# Patient Record
Sex: Male | Born: 1990 | Race: Black or African American | Hispanic: No | Marital: Single | Smoking: Current every day smoker
Health system: Southern US, Community
[De-identification: ages and names within clinical notes are randomized; demographics above are authoritative.]

## PROBLEM LIST (undated history)

## (undated) HISTORY — PX: APPENDECTOMY: SHX54

---

## 2014-12-26 ENCOUNTER — Emergency Department (HOSPITAL_BASED_OUTPATIENT_CLINIC_OR_DEPARTMENT_OTHER)
Admission: EM | Admit: 2014-12-26 | Discharge: 2014-12-26 | Disposition: A | Discharge status: Home / Self Care | Payer: No Typology Code available for payment source | Attending: Emergency Medicine | Admitting: Emergency Medicine

## 2014-12-26 ENCOUNTER — Encounter (HOSPITAL_BASED_OUTPATIENT_CLINIC_OR_DEPARTMENT_OTHER): Payer: Self-pay | Admitting: *Deleted

## 2014-12-26 ENCOUNTER — Emergency Department (HOSPITAL_BASED_OUTPATIENT_CLINIC_OR_DEPARTMENT_OTHER): Payer: No Typology Code available for payment source

## 2014-12-26 DIAGNOSIS — Y92410 Unspecified street and highway as the place of occurrence of the external cause: Secondary | ICD-10-CM | POA: Diagnosis not present

## 2014-12-26 DIAGNOSIS — S0121XA Laceration without foreign body of nose, initial encounter: Secondary | ICD-10-CM | POA: Insufficient documentation

## 2014-12-26 DIAGNOSIS — Y998 Other external cause status: Secondary | ICD-10-CM | POA: Diagnosis not present

## 2014-12-26 DIAGNOSIS — Y9389 Activity, other specified: Secondary | ICD-10-CM | POA: Insufficient documentation

## 2014-12-26 DIAGNOSIS — S299XXA Unspecified injury of thorax, initial encounter: Secondary | ICD-10-CM | POA: Diagnosis not present

## 2014-12-26 DIAGNOSIS — Z23 Encounter for immunization: Secondary | ICD-10-CM | POA: Insufficient documentation

## 2014-12-26 DIAGNOSIS — R51 Headache: Secondary | ICD-10-CM

## 2014-12-26 DIAGNOSIS — S0990XA Unspecified injury of head, initial encounter: Secondary | ICD-10-CM | POA: Diagnosis not present

## 2014-12-26 DIAGNOSIS — R519 Headache, unspecified: Secondary | ICD-10-CM

## 2014-12-26 DIAGNOSIS — R0781 Pleurodynia: Secondary | ICD-10-CM

## 2014-12-26 MED ORDER — KETOROLAC TROMETHAMINE 60 MG/2ML IM SOLN
60.0000 mg | Freq: Once | INTRAMUSCULAR | Status: AC
  Administered 2014-12-26: 60 mg via INTRAMUSCULAR
  Filled 2014-12-26: qty 2

## 2014-12-26 MED ORDER — TETANUS-DIPHTH-ACELL PERTUSSIS 5-2.5-18.5 LF-MCG/0.5 IM SUSP
0.5000 mL | Freq: Once | INTRAMUSCULAR | Status: AC
  Administered 2014-12-26: 0.5 mL via INTRAMUSCULAR
  Filled 2014-12-26: qty 0.5
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
## 2016-01-09 ENCOUNTER — Emergency Department (HOSPITAL_BASED_OUTPATIENT_CLINIC_OR_DEPARTMENT_OTHER)
Admission: EM | Admit: 2016-01-09 | Discharge: 2016-01-09 | Disposition: A | Discharge status: Home / Self Care | Payer: Medicaid Other | Attending: Emergency Medicine | Admitting: Emergency Medicine

## 2016-01-09 ENCOUNTER — Encounter (HOSPITAL_BASED_OUTPATIENT_CLINIC_OR_DEPARTMENT_OTHER): Payer: Self-pay | Admitting: *Deleted

## 2016-01-09 DIAGNOSIS — K0889 Other specified disorders of teeth and supporting structures: Secondary | ICD-10-CM | POA: Insufficient documentation

## 2016-01-09 MED ORDER — OXYCODONE-ACETAMINOPHEN 5-325 MG PO TABS
1.0000 | ORAL_TABLET | Freq: Once | ORAL | Status: AC
  Administered 2016-01-09: 1 via ORAL
  Filled 2016-01-09: qty 1

## 2016-01-09 MED ORDER — IBUPROFEN 800 MG PO TABS: 800.0000 mg | ORAL_TABLET | Freq: Three times a day (TID) | ORAL | Status: DC

## 2016-01-09 MED ORDER — PENICILLIN V POTASSIUM 250 MG PO TABS
500.0000 mg | ORAL_TABLET | Freq: Once | ORAL | Status: AC
  Administered 2016-01-09: 500 mg via ORAL
  Filled 2016-01-09: qty 2

## 2016-01-09 MED ORDER — PENICILLIN V POTASSIUM 500 MG PO TABS
500.0000 mg | ORAL_TABLET | Freq: Four times a day (QID) | ORAL | Status: AC
Start: 2016-01-09 — End: 2016-01-16

## 2016-01-09 NOTE — Discharge Instructions (Signed)
You have a dental injury. It is very important that you get evaluated by a dentist as soon as possible. Call tomorrow to schedule an appointment. Take your full course of antibiotics. Read the instructions below. ° °Eat a soft or liquid diet and rinse your mouth out after meals with warm water. You should see a dentist or return here at once if you have increased swelling, increased pain or uncontrolled bleeding from the site of your injury. ° °SEEK MEDICAL CARE IF:  °· You have increased pain not controlled with medicines.  °· You have swelling around your tooth, in your face or neck.  °· You have bleeding which starts, continues, or gets worse.  °· You have a fever >101 °· If you are unable to open your mouth ° °Community Resource Guide Dental °The United Way’s “211” is a great source of information about community services available.  Access by dialing 2-1-1 from anywhere in [place], or by website -  www.nc211.org.  ° °Other Local Resources (Updated 08/2015) ° °Dental  Care °  °Services ° °  °Phone Number and Address  °Cost  °[facility] For children 0 - 21 years of age:  °• Cleaning °• Tooth brushing/flossing instruction °• Sealants, fillings, crowns °• Extractions °• Emergency treatment  [phone] °[address] °[place], [place] [address] Charges based on family income.  Medicaid and some insurance plans accepted.   °  °[facility] [place] • Cleaning °• Sealants, fillings, crowns °• Extractions °• Emergency treatment [phone] °[address] °[place], [place] ° Pregnant women 18 years of age or older with a Medicaid card  °[facility] [place] • Cleaning °• Sealants, fillings, crowns °• Extractions °• Emergency treatment [phone] °[address] °[place], [place] Pregnant women 18 years of age or older with a Medicaid card  °[facility] For children 0 - 21 years of age:  °• Cleaning °• Tooth brushing/flossing instruction °• Sealants, fillings, crowns °• Extractions °• Emergency treatment °Limited orthodontic services for patients with Medicaid [phone] °[address] °, [place] [address] Medicaid and [place] Health Choice cover for children up to age 21 and pregnant women.  Parents of children up to age 21 without Medicaid pay a reduced fee at time of service.  °[facility] [place] For children 0 - 21 years of age:  °• Cleaning °• Tooth brushing/flossing instruction °• Sealants, fillings, crowns °• Extractions °• Emergency treatment °Limited orthodontic services for patients with Medicaid [phone] °[address] °[place], [place].  Medicaid and [place] Health Choice cover for children up to age 21 and pregnant women.  Parents of children up to age 21 without Medicaid pay a reduced fee.  °[facility] • Cleaning °• Sealants, fillings, crowns °• Extractions ° °Hours: Tuesdays and Thursdays, 4:15 - 8 pm [phone] °[address] °[place],  [address] Services free of charge to [place] residents ages 18-64 who do not have health insurance, Medicare, Medicaid, or VA benefits and fall within federal poverty guidelines  °[facility] ° ° ° Provides dental care in addition to primary medical care, nutritional counseling, and pharmacy: °• Cleaning °• Sealants, fillings, crowns °• Extractions ° ° ° ° ° ° ° ° ° ° ° ° ° ° ° ° ° [phone] °[facility],   [address] °[place], [place] ° °[phone] °[facility], [address] [place], [place] ° °[phone] °[facility] °[place], [place] ° °[phone] °[facility], [address] °, [place] ° °[phone] °[facility] °[address] °[place], [place]  Accepts Medicaid, Medicare, most insurance.  Also provides services available to all with fees adjusted based on ability to pay.    °[facility] • Cleaning °• Tooth brushing/flossing instruction °• Sealants, fillings, crowns °• Extractions °• Emergency treatment °Hours: Tuesdays, Thursdays, and Fridays from 8 am to 5 pm by appointment only. [phone] °[address] °[place], [place] [address] [place] residents with Medicaid (depending on eligibility) and children with [facility] Health Choice - call for more information.  °[facility] • Extractions only ° °Hours: 2nd and 4th Thursday of each month from 6:30 am - 9 am.   [phone] ext. [phone] °[address] °[place], [place] [address] Ages 18 and older only.  Patients are seen on a first come, first served basis.  °[facility] • Cleanings °• Fillings °• Extractions °• Orthodontics °• Endodontics °• Implants/Crowns/Bridges °• Complete and partial dentures [phone] °[place], [place] Patients must complete an application for services.  There is often a waiting list.   ° °

## 2016-01-09 NOTE — ED Provider Notes (Signed)
CSN: [id number]     Arrival date & time 01/09/16  1057 History   First MD Initiated Contact with Patient 01/09/16 1216     Chief Complaint  Patient presents with  . Dental Pain    (Consider location/radiation/quality/duration/timing/severity/associated sxs/prior Treatment) Patient is a 25 y.o. male presenting with tooth pain. The history is provided by the patient and medical records. No language interpreter was used.  Dental Pain Associated symptoms: no fever and no neck pain    Mitchell Hicks is a 25 y.o. male  with no pertinent PMH who presents to the Emergency Department complaining of right lower dental pain which has been progressively worsening over the last week. Ibuprofen taken with little relief. No difficulty swallowing, trouble breathing, sob, neck pain, fever. Has not seen dentist about issue.   History reviewed. No pertinent past medical history. Past Surgical History  Procedure Laterality Date  . Appendectomy     No family history on file. Social History  Substance Use Topics  . Smoking status: Never Smoker   . Smokeless tobacco: Never Used  . Alcohol Use: No    Review of Systems  Constitutional: Negative for fever.  HENT: Positive for dental problem.   Musculoskeletal: Negative for neck pain.      Allergies  Review of patient's allergies indicates no known allergies.  Home Medications   Prior to Admission medications   Medication Sig Start Date End Date Taking? Authorizing Provider  ibuprofen (ADVIL,MOTRIN) 800 MG tablet Take 1 tablet (800 mg total) by mouth 3 (three) times daily. 01/09/16   Chase PicketJaime Pilcher Jaxn Chiquito, PA-C  penicillin v potassium (VEETID) 500 MG tablet Take 1 tablet (500 mg total) by mouth 4 (four) times daily. 01/09/16 01/16/16  Liliana Dang Pilcher Calandria Mullings, PA-C   BP 131/83 mmHg  Pulse 62  Temp(Src) 99.4 F (37.4 C) (Oral)  Resp 18  Ht 5\' 10"  (1.778 m)  Wt 72.576 kg  BMI 22.96 kg/m2  SpO2 100% Physical Exam  Constitutional: He is oriented to  person, place, and time. He appears well-developed and well-nourished.  Alert and in no acute distress  HENT:  Head: Normocephalic and atraumatic.  Mouth/Throat:    Dental cavities and poor oral dentition noted, pain along tooth as depicted in image, midline uvula, no trismus, oropharynx moist and clear, no abscess noted, no oropharyngeal erythema or edema, neck supple and no tenderness. No facial edema  Cardiovascular: Normal rate, regular rhythm and normal heart sounds.  Exam reveals no gallop and no friction rub.   No murmur heard. Pulmonary/Chest: Effort normal and breath sounds normal. No respiratory distress.  Musculoskeletal: Normal range of motion.  Neurological: He is alert and oriented to person, place, and time.  Skin: Skin is warm and dry.  Nursing note and vitals reviewed.   ED Course  Procedures (including critical care time) Labs Review Labs Reviewed - No data to display  Imaging Review No results found. I have personally reviewed and evaluated these images and lab results as part of my medical decision-making.   EKG Interpretation None      MDM   Final diagnoses:  Dentalgia   Patient with dentalgia. No abscess requiring immediate incision and drainage. Patient is afebrile, non toxic appearing, and swallowing secretions well. Exam not concerning for Ludwig's angina or pharyngeal abscess. Will treat with PenVK, ibuprofen given for pain. I gave patient dental resource guide and stressed the importance of dental follow up for ultimate management of dental pain. Patient voices understanding and is agreeable to  plan.   Chase Picket Teva Bronkema, PA-C 01/09/16 1259  Rolland Porter, MD 01/13/16 [phone]

## 2016-01-09 NOTE — ED Notes (Signed)
Patient states he has pain from a right lower wisdom tooth for one week.

## 2016-01-23 ENCOUNTER — Emergency Department (HOSPITAL_BASED_OUTPATIENT_CLINIC_OR_DEPARTMENT_OTHER)
Admission: EM | Admit: 2016-01-23 | Discharge: 2016-01-23 | Disposition: A | Discharge status: Home / Self Care | Payer: Medicare Other | Attending: Emergency Medicine | Admitting: Emergency Medicine

## 2016-01-23 ENCOUNTER — Encounter (HOSPITAL_BASED_OUTPATIENT_CLINIC_OR_DEPARTMENT_OTHER): Payer: Self-pay | Admitting: *Deleted

## 2016-01-23 DIAGNOSIS — Z202 Contact with and (suspected) exposure to infections with a predominantly sexual mode of transmission: Secondary | ICD-10-CM | POA: Diagnosis not present

## 2016-01-23 DIAGNOSIS — Z711 Person with feared health complaint in whom no diagnosis is made: Secondary | ICD-10-CM

## 2016-01-23 LAB — URINALYSIS, ROUTINE W REFLEX MICROSCOPIC
Bilirubin Urine: NEGATIVE
GLUCOSE, UA: NEGATIVE mg/dL
Hgb urine dipstick: NEGATIVE
Ketones, ur: NEGATIVE mg/dL
Leukocytes, UA: NEGATIVE
Nitrite: NEGATIVE
Protein, ur: NEGATIVE mg/dL
Specific Gravity, Urine: 1.017 (ref 1.005–1.030)
pH: 7 (ref 5.0–8.0)

## 2016-01-23 NOTE — ED Provider Notes (Signed)
CSN: [id number]     Arrival date & time 01/23/16  1422 History   First MD Initiated Contact with Patient 01/23/16 1529     Chief Complaint  Patient presents with  . Exposure to STD    Mitchell Hicks is a 25 y.o. male who presents to the ED Requesting check for STDs. He presents here with his girlfriend who has been diagnosed with bacterial vaginosis. He reports he has had some intermittent pain to his abdomen that can sometimes radiate to his testicles. He last had pain yesterday. He denies current abdominal or testicle pain. He denies any symptoms of STDs. He denies any penile discharge or penile pain. He denies any scrotal swelling. He reports he is sexually active with one partner and does not use protection. No history of STDs. Patient denies fevers, vomiting, diarrhea, rashes, scrotal swelling, penile discharge, GU rashes, urinary symptoms, hematuria.    Patient is a 25 y.o. male presenting with STD exposure. The history is provided by the patient. No language interpreter was used.  Exposure to STD Associated symptoms include abdominal pain (resolved. ). Pertinent negatives include no chest pain, chills, coughing, fever, headaches, nausea, neck pain, rash, sore throat or vomiting.    History reviewed. No pertinent past medical history. Past Surgical History  Procedure Laterality Date  . Appendectomy     History reviewed. No pertinent family history. Social History  Substance Use Topics  . Smoking status: Never Smoker   . Smokeless tobacco: Never Used  . Alcohol Use: No    Review of Systems  Constitutional: Negative for fever and chills.  HENT: Negative for mouth sores and sore throat.   Eyes: Negative for visual disturbance.  Respiratory: Negative for cough and shortness of breath.   Cardiovascular: Negative for chest pain.  Gastrointestinal: Positive for abdominal pain (resolved. ). Negative for nausea, vomiting and diarrhea.  Genitourinary: Negative for dysuria, urgency,  frequency, hematuria, discharge, penile swelling, scrotal swelling, difficulty urinating, genital sores, penile pain and testicular pain.  Musculoskeletal: Negative for back pain and neck pain.  Skin: Negative for rash.  Neurological: Negative for headaches.      Allergies  Review of patient's allergies indicates no known allergies.  Home Medications   Prior to Admission medications   Not on File   BP 132/82 mmHg  Pulse 88  Temp(Src) 98.2 F (36.8 C) (Oral)  Resp 18  Ht  (1.778 m)  Wt 68.04 kg  BMI 21.52 kg/m2  SpO2 100% Physical Exam  Constitutional: He appears well-developed and well-nourished. No distress.  Nontoxic appearing.  HENT:  Head: Normocephalic and atraumatic.  Mouth/Throat: Oropharynx is clear and moist. No oropharyngeal exudate.  Eyes: Conjunctivae are normal. Pupils are equal, round, and reactive to light. Right eye exhibits no discharge. Left eye exhibits no discharge.  Neck: Neck supple.  Cardiovascular: Normal rate, regular rhythm, normal heart sounds and intact distal pulses.   Pulmonary/Chest: Effort normal and breath sounds normal. No respiratory distress. He has no wheezes. He has no rales.  Abdominal: Soft. Bowel sounds are normal. He exhibits no distension and no mass. There is no tenderness. There is no rebound and no guarding.  Abdomen soft and nontender to palpation. Bowel sounds are present.  Genitourinary: Penis normal. No penile tenderness.  GU exam performed by me with male nurse tech chaperone. No GU rashes noted. No penile tenderness or discharge. Scrotal contents is normal. No scrotal swelling or tenderness. No inguinal hernias appreciated.  Musculoskeletal: He exhibits no edema.  Lymphadenopathy:    He has no cervical adenopathy.  Neurological: He is alert. Coordination normal.  Skin: Skin is warm and dry. No rash noted. He is not diaphoretic. No erythema. No pallor.  Psychiatric: He has a normal mood and affect. His behavior is  normal.  Nursing note and vitals reviewed.   ED Course  Procedures (including critical care time) Labs Review Labs Reviewed  URINALYSIS, ROUTINE W REFLEX MICROSCOPIC (NOT [facility])  RPR  HIV ANTIBODY (ROUTINE TESTING)  GC/CHLAMYDIA PROBE AMP ([place]) NOT [facility]    Imaging Review No results found. I have personally reviewed and evaluated these lab results as part of my medical decision-making.   EKG Interpretation None      Filed Vitals:   01/23/16 1425  BP: 132/82  Pulse: 88  Temp: 98.2 F (36.8 C)  TempSrc: Oral  Resp: 18  Height: 5\' 10"  (1.778 m)  Weight: 68.04 kg  SpO2: 100%     MDM   Meds given in ED:  Medications - No data to display  New Prescriptions   No medications on file    Final diagnoses:  Concern about STD in male without diagnosis   This is a 25 y.o. male who presents to the ED Requesting check for STDs. He presents here with his girlfriend who has been diagnosed with bacterial vaginosis. He reports he has had some intermittent pain to his abdomen that can sometimes radiate to his testicles. He last had pain yesterday. He denies current abdominal or testicle pain. He denies any symptoms of STDs. He denies any penile discharge or penile pain. He denies any scrotal swelling. He reports he is sexually active with one partner and does not use protection.  On exam the patient is afebrile and nontoxic appearing. His abdomen is soft and nontender to palpation. GU exam is unremarkable. No penile discharge. No penile or scrotal tenderness to palpation. We'll check for syphilis, HIV, gonorrhea and chlamydia. I advised that these tests are pending and he should follow-up in about 3 days for the results. Urinalysis is within normal limits. As the patient has no current symptoms of an STD, will wait for treatment until test results return. I encouraged the patient to abstain from sex until he has test results. Discussed safe sex practices. I advised the patient  to follow-up with their primary care provider this week. I advised the patient to return to the emergency department with new or worsening symptoms or new concerns. The patient verbalized understanding and agreement with plan.      Everlene FarrierWilliam Amanda Steuart, PA-C 01/23/16 1616  Laurence Spatesachel Morgan Little, MD 01/24/16 [phone]

## 2016-01-23 NOTE — ED Notes (Signed)
Pt c/o abd and scrotal pain for a couple of days. Denies d/c or urinary s/s.

## 2016-01-23 NOTE — ED Notes (Signed)
Pt requesting STD check. Denies symptoms.  

## 2016-01-23 NOTE — ED Notes (Signed)
PA in to evaluate pt.  

## 2016-01-23 NOTE — Discharge Instructions (Signed)
We have labs pending for gonorrhea, chlamydia, HIV and syphilis for you. These tests take approximately 3 days to return. Please follow-up in approximately 3 days on these test results. Please see take over [facility] health Department for routine STD checks.  Sexually Transmitted Disease A sexually transmitted disease (STD) is a disease or infection that may be passed (transmitted) from person to person, usually during sexual activity. This may happen by way of saliva, semen, blood, vaginal mucus, or urine. Common STDs include:  Gonorrhea.  Chlamydia.  Syphilis.  HIV and AIDS.  Genital herpes.  Hepatitis B and C.  Trichomonas.  Human papillomavirus (HPV).  Pubic lice.  Scabies.  Mites.  Bacterial vaginosis. WHAT ARE CAUSES OF STDs? An STD may be caused by bacteria, a virus, or parasites. STDs are often transmitted during sexual activity if one person is infected. However, they may also be transmitted through nonsexual means. STDs may be transmitted after:   Sexual intercourse with an infected person.  Sharing sex toys with an infected person.  Sharing needles with an infected person or using unclean piercing or tattoo needles.  Having intimate contact with the genitals, mouth, or rectal areas of an infected person.  Exposure to infected fluids during birth. WHAT ARE THE SIGNS AND SYMPTOMS OF STDs? Different STDs have different symptoms. Some people may not have any symptoms. If symptoms are present, they may include:  Painful or bloody urination.  Pain in the pelvis, abdomen, vagina, anus, throat, or eyes.  A skin rash, itching, or irritation.  Growths, ulcerations, blisters, or sores in the genital and anal areas.  Abnormal vaginal discharge with or without bad odor.  Penile discharge in men.  Fever.  Pain or bleeding during sexual intercourse.  Swollen glands in the groin area.  Yellow skin and eyes (jaundice). This is seen with hepatitis.  Swollen  testicles.  Infertility.  Sores and blisters in the mouth. HOW ARE STDs DIAGNOSED? To make a diagnosis, your health care provider may:  Take a medical history.  Perform a physical exam.  Take a sample of any discharge to examine.  Swab the throat, cervix, opening to the penis, rectum, or vagina for testing.  Test a sample of your first morning urine.  Perform blood tests.  Perform a Pap test, if this applies.  Perform a colposcopy.  Perform a laparoscopy. HOW ARE STDs TREATED? Treatment depends on the STD. Some STDs may be treated but not cured.  Chlamydia, gonorrhea, trichomonas, and syphilis can be cured with antibiotic medicine.  Genital herpes, hepatitis, and HIV can be treated, but not cured, with prescribed medicines. The medicines lessen symptoms.  Genital warts from HPV can be treated with medicine or by freezing, burning (electrocautery), or surgery. Warts may come back.  HPV cannot be cured with medicine or surgery. However, abnormal areas may be removed from the cervix, vagina, or vulva.  If your diagnosis is confirmed, your recent sexual partners need treatment. This is true even if they are symptom-free or have a negative culture or evaluation. They should not have sex until their health care providers say it is okay.  Your health care provider may test you for infection again 3 months after treatment. HOW CAN I REDUCE MY RISK OF GETTING AN STD? Take these steps to reduce your risk of getting an STD:  Use latex condoms, dental dams, and water-soluble lubricants during sexual activity. Do not use petroleum jelly or oils.  Avoid having multiple sex partners.  Do not have sex  with someone who has other sex partners  Do not have sex with anyone you do not know or who is at high risk for an STD.  Avoid risky sex practices that can break your skin.  Do not have sex if you have open sores on your mouth or skin.  Avoid drinking too much alcohol or taking  illegal drugs. Alcohol and drugs can affect your judgment and put you in a vulnerable position.  Avoid engaging in oral and anal sex acts.  Get vaccinated for HPV and hepatitis. If you have not received these vaccines in the past, talk to your health care provider about whether one or both might be right for you.  If you are at risk of being infected with HIV, it is recommended that you take a prescription medicine daily to prevent HIV infection. This is called pre-exposure prophylaxis (PrEP). You are considered at risk if:  You are a man who has sex with other men (MSM).  You are a heterosexual man or woman and are sexually active with more than one partner.  You take drugs by injection.  You are sexually active with a partner who has HIV.  Talk with your health care provider about whether you are at high risk of being infected with HIV. If you choose to begin PrEP, you should first be tested for HIV. You should then be tested every 3 months for as long as you are taking PrEP. WHAT SHOULD I DO IF I THINK I HAVE AN STD?  See your health care provider.  Tell your sexual partner(s). They should be tested and treated for any STDs.  Do not have sex until your health care provider says it is okay. WHEN SHOULD I GET IMMEDIATE MEDICAL CARE? Contact your health care provider right away if:   You have severe abdominal pain.  You are a man and notice swelling or pain in your testicles.  You are a woman and notice swelling or pain in your vagina.   This information is not intended to replace advice given to you by your health care provider. Make sure you discuss any questions you have with your health care provider.   Document Released: 10/28/2002 Document Revised: 08/28/2014 Document Reviewed: 02/25/2013 Elsevier Interactive Patient Education [employer]2016 Elsevier Inc.

## 2016-01-23 NOTE — ED Notes (Signed)
Pt given d/c instructions as per chart. Verbalizes understanding. No questions. 

## 2016-01-23 NOTE — ED Notes (Signed)
Patient eating chips in the waiting room prior to triage, Patient obtaining urine sample at this time.

## 2016-01-24 LAB — GC/CHLAMYDIA PROBE AMP (~~LOC~~) NOT AT ARMC
CHLAMYDIA, DNA PROBE: NEGATIVE
Neisseria Gonorrhea: NEGATIVE

## 2016-01-25 LAB — RPR: RPR Ser Ql: NONREACTIVE

## 2016-01-25 LAB — HIV ANTIBODY (ROUTINE TESTING W REFLEX): HIV Screen 4th Generation wRfx: NONREACTIVE

## 2017-09-12 ENCOUNTER — Other Ambulatory Visit: Payer: Self-pay

## 2017-09-12 ENCOUNTER — Encounter (HOSPITAL_BASED_OUTPATIENT_CLINIC_OR_DEPARTMENT_OTHER): Payer: Self-pay

## 2017-09-12 ENCOUNTER — Emergency Department (HOSPITAL_BASED_OUTPATIENT_CLINIC_OR_DEPARTMENT_OTHER)
Admission: EM | Admit: 2017-09-12 | Discharge: 2017-09-12 | Disposition: A | Discharge status: Home / Self Care | Payer: Medicare Other | Attending: Physician Assistant | Admitting: Physician Assistant

## 2017-09-12 DIAGNOSIS — F17200 Nicotine dependence, unspecified, uncomplicated: Secondary | ICD-10-CM | POA: Insufficient documentation

## 2017-09-12 DIAGNOSIS — R05 Cough: Secondary | ICD-10-CM | POA: Diagnosis present

## 2017-09-12 DIAGNOSIS — J111 Influenza due to unidentified influenza virus with other respiratory manifestations: Secondary | ICD-10-CM | POA: Diagnosis not present

## 2017-09-12 DIAGNOSIS — R69 Illness, unspecified: Secondary | ICD-10-CM

## 2017-09-12 MED ORDER — OSELTAMIVIR PHOSPHATE 75 MG PO CAPS: 75.0000 mg | ORAL_CAPSULE | Freq: Two times a day (BID) | ORAL | 0 refills | Status: AC

## 2017-09-12 MED ORDER — BENZONATATE 100 MG PO CAPS: 100.0000 mg | ORAL_CAPSULE | Freq: Three times a day (TID) | ORAL | 0 refills | Status: AC | PRN

## 2017-09-12 MED ORDER — ONDANSETRON 4 MG PO TBDP: 4.0000 mg | ORAL_TABLET | Freq: Three times a day (TID) | ORAL | 0 refills | Status: AC | PRN

## 2017-09-12 MED ORDER — IBUPROFEN 800 MG PO TABS
800.0000 mg | ORAL_TABLET | Freq: Once | ORAL | Status: AC
  Administered 2017-09-12: 800 mg via ORAL
  Filled 2017-09-12: qty 1

## 2017-09-12 NOTE — ED Triage Notes (Signed)
C/o flu like sx x 3 days-NAD-steady gait 

## 2017-09-12 NOTE — ED Provider Notes (Signed)
[facility] EMERGENCY DEPARTMENT Provider Note   CSN: [id number] Arrival date & time: 09/12/17  2004     History   Chief Complaint Chief Complaint  Patient presents with  . Cough    HPI Mitchell Hicks is a 27 y.o. male.  HPI   Patient here with flulike symptoms.  People at patient has mild nausea, decreased appetite, high fevers, body aches.  Patient's significant other has been treating with ibuprofen Tylenol.  Patient has children at home, all above of the age of 6 months.  History reviewed. No pertinent past medical history.  There are no active problems to display for this patient.   Past Surgical History:  Procedure Laterality Date  . APPENDECTOMY         Home Medications    Prior to Admission medications   Medication Sig Start Date End Date Taking? Authorizing Provider  benzonatate (TESSALON PERLES) 100 MG capsule Take 1 capsule (100 mg total) by mouth 3 (three) times daily as needed for cough. 09/12/17   Morad Tal Lyn, MD  ondansetron (ZOFRAN ODT) 4 MG disintegrating tablet Take 1 tablet (4 mg total) by mouth every 8 (eight) hours as needed for nausea or vomiting. 09/12/17   Mehek Grega Lyn, MD  oseltamivir (TAMIFLU) 75 MG capsule Take 1 capsule (75 mg total) by mouth every 12 (twelve) hours. 09/12/17   Jessicah Croll, Cindee Saltourteney Lyn, MD    Family History No family history on file.  Social History Social History   Tobacco Use  . Smoking status: Current Every Day Smoker  . Smokeless tobacco: Never Used  Substance Use Topics  . Alcohol use: No  . Drug use: No     Allergies   Patient has no known allergies.   Review of Systems Review of Systems  Constitutional: Positive for fatigue and fever. Negative for activity change.  HENT: Positive for congestion and sore throat.   Respiratory: Positive for cough. Negative for shortness of breath.   Cardiovascular: Negative for chest pain.  Gastrointestinal: Negative for abdominal pain.       Physical Exam Updated Vital Signs BP 120/75 (BP Location: Left Arm)   Pulse 95   Temp 100.2 F (37.9 C) (Oral)   Resp 20   Ht 5\' 11"  (1.803 m)   Wt 65 kg (143 lb 4.8 oz)   SpO2 98%   BMI 19.99 kg/m   Physical Exam  Constitutional: He is oriented to person, place, and time. He appears well-nourished.  HENT:  Head: Normocephalic.  Mouth/Throat: Oropharynx is clear and moist.  Eyes: Conjunctivae are normal. Right eye exhibits no discharge. Left eye exhibits no discharge.  Cardiovascular: Normal rate and regular rhythm.  No murmur heard. Pulmonary/Chest: Effort normal and breath sounds normal. No respiratory distress.  Neurological: He is oriented to person, place, and time.  Skin: Skin is warm and dry. He is not diaphoretic.  Psychiatric: He has a normal mood and affect. His behavior is normal.     ED Treatments / Results  Labs (all labs ordered are listed, but only abnormal results are displayed) Labs Reviewed - No data to display  EKG  EKG Interpretation None       Radiology No results found.  Procedures Procedures (including critical care time)  Medications Ordered in ED Medications  ibuprofen (ADVIL,MOTRIN) tablet 800 mg (800 mg Oral Given 09/12/17 2021)     Initial Impression / Assessment and Plan / ED Course  I have reviewed the triage vital signs and the nursing  notes.  Pertinent labs & imaging results that were available during my care of the patient were reviewed by me and considered in my medical decision making (see chart for details).    Patient here with flulike symptoms.  People at patient has mild nausea, decreased appetite, high fevers, body aches.  Patient's significant other has been treating with ibuprofen Tylenol.  Patient has children at home, all above of the age of 6 months.  9:50 PM Sounds consistent with the flu.  Discussed Tamiflu, will give prescription.  Will give prescription for Zofran and Tessalon Perles.  Will have  follow-up with primary care physician.  Discussed ibuprofen and Tylenol to keep fever down.  Final Clinical Impressions(s) / ED Diagnoses   Final diagnoses:  Influenza-like illness    ED Discharge Orders        Ordered    oseltamivir (TAMIFLU) 75 MG capsule  Every 12 hours     09/12/17 2148    ondansetron (ZOFRAN ODT) 4 MG disintegrating tablet  Every 8 hours PRN     09/12/17 2148    benzonatate (TESSALON PERLES) 100 MG capsule  3 times daily PRN     09/12/17 2148       Abelino Derrick, MD 09/12/17 2151

## 2017-09-12 NOTE — Discharge Instructions (Signed)
You have the flu.  Please do not exposures anyone else to your germs.  Please wash hands.  Please stay inside.  Please take ibuprofen and Tylenol alternatively to help with your symptoms.  Please return with any concerns.

## 2018-03-25 ENCOUNTER — Emergency Department (HOSPITAL_BASED_OUTPATIENT_CLINIC_OR_DEPARTMENT_OTHER)
Admission: EM | Admit: 2018-03-25 | Discharge: 2018-03-25 | Disposition: A | Discharge status: Home / Self Care | Payer: Medicare Other | Attending: Emergency Medicine | Admitting: Emergency Medicine

## 2018-03-25 ENCOUNTER — Other Ambulatory Visit: Payer: Self-pay

## 2018-03-25 ENCOUNTER — Encounter (HOSPITAL_BASED_OUTPATIENT_CLINIC_OR_DEPARTMENT_OTHER): Payer: Self-pay | Admitting: Emergency Medicine

## 2018-03-25 ENCOUNTER — Emergency Department (HOSPITAL_BASED_OUTPATIENT_CLINIC_OR_DEPARTMENT_OTHER): Payer: Medicare Other

## 2018-03-25 DIAGNOSIS — R079 Chest pain, unspecified: Secondary | ICD-10-CM | POA: Diagnosis present

## 2018-03-25 DIAGNOSIS — F17210 Nicotine dependence, cigarettes, uncomplicated: Secondary | ICD-10-CM | POA: Insufficient documentation

## 2018-03-25 DIAGNOSIS — R0789 Other chest pain: Secondary | ICD-10-CM | POA: Insufficient documentation

## 2018-03-25 MED ORDER — IBUPROFEN 400 MG PO TABS
400.0000 mg | ORAL_TABLET | Freq: Once | ORAL | Status: AC
  Administered 2018-03-25: 400 mg via ORAL
  Filled 2018-03-25: qty 1

## 2018-03-25 NOTE — ED Triage Notes (Signed)
Pt ran into the basketball pole yesterday while playing basketball.  Presents for pain in the left side of his chest where he hit the pole.  Also pain in both ankles where the "curled" when he came down on them. Also pain in both wrists.  No direct injury to his wrists but pt sts his wrists have been "messed up for a while" and hitting the pole has caused them to hurt as well. Pt denies falling.

## 2018-03-25 NOTE — ED Provider Notes (Signed)
[facility] EMERGENCY DEPARTMENT Provider Note   CSN: [id number] Arrival date & time: 03/25/18  1015     History   Chief Complaint Chief Complaint  Patient presents with  . Chest Pain    HPI Mitchell Hicks is a 27 y.o. male.  Patient is a healthy 27 year old male presenting today with left-sided chest pain that started after he ran into a pole while playing basketball.  He states the pain is just persisted and is much worse if he coughs or takes a deep breath.  He denies any shortness of breath.  He has no abdominal pain.  He does state that his left wrist and both ankles are sore but it was the first time he had played basketball in quite some time.  The history is provided by the patient.  Chest Pain   This is a new problem. The current episode started yesterday. The problem occurs constantly. The problem has not changed since onset.Associated with: started after he ran into a pole while playing basketball. The pain is present in the lateral region. The pain is at a severity of 5/10. The pain is moderate. The quality of the pain is described as pleuritic. The pain does not radiate. Duration of episode(s) is 1 day. The symptoms are aggravated by deep breathing and certain positions. Pertinent negatives include no cough, no fever, no palpitations and no shortness of breath. He has tried rest for the symptoms. The treatment provided no relief. There are no known risk factors.    History reviewed. No pertinent past medical history.  There are no active problems to display for this patient.   Past Surgical History:  Procedure Laterality Date  . APPENDECTOMY          Home Medications    Prior to Admission medications   Medication Sig Start Date End Date Taking? Authorizing Provider  benzonatate (TESSALON PERLES) 100 MG capsule Take 1 capsule (100 mg total) by mouth 3 (three) times daily as needed for cough. 09/12/17   Mackuen, Courteney Lyn, MD  ondansetron (ZOFRAN  ODT) 4 MG disintegrating tablet Take 1 tablet (4 mg total) by mouth every 8 (eight) hours as needed for nausea or vomiting. 09/12/17   Mackuen, Courteney Lyn, MD  oseltamivir (TAMIFLU) 75 MG capsule Take 1 capsule (75 mg total) by mouth every 12 (twelve) hours. 09/12/17   Mackuen, Cindee Salt, MD    Family History No family history on file.  Social History Social History   Tobacco Use  . Smoking status: Current Every Day Smoker    Packs/day: 1.00    Types: Cigarettes  . Smokeless tobacco: Never Used  Substance Use Topics  . Alcohol use: No  . Drug use: No     Allergies   Patient has no known allergies.   Review of Systems Review of Systems  Constitutional: Negative for fever.  Respiratory: Negative for cough and shortness of breath.   Cardiovascular: Positive for chest pain. Negative for palpitations.  All other systems reviewed and are negative.    Physical Exam Updated Vital Signs BP 113/66 (BP Location: Right Arm)   Pulse 82   Temp 97.9 F (36.6 C) (Oral)   Resp 16   Ht 5\' 11"  (1.803 m)   Wt 65.8 kg (145 lb)   SpO2 100%   BMI 20.22 kg/m   Physical Exam  Constitutional: He is oriented to person, place, and time. He appears well-developed and well-nourished. No distress.  HENT:  Head: Normocephalic and atraumatic.  Mouth/Throat: Oropharynx is clear and moist.  Eyes: Pupils are equal, round, and reactive to light. Conjunctivae and EOM are normal.  Neck: Normal range of motion. Neck supple.  Cardiovascular: Normal rate, regular rhythm and intact distal pulses.  No murmur heard. Pulmonary/Chest: Effort normal and breath sounds normal. No respiratory distress. He has no wheezes. He has no rales. He exhibits bony tenderness. He exhibits no crepitus.    Abdominal: Soft. He exhibits no distension. There is no tenderness. There is no rebound and no guarding.  Musculoskeletal: Normal range of motion. He exhibits no edema or tenderness.  Full range of motion of  bilateral wrists and ankles.  No localized point tenderness.  No swelling identified.  Neurological: He is alert and oriented to person, place, and time.  Skin: Skin is warm and dry. No rash noted. No erythema.  Psychiatric: He has a normal mood and affect. His behavior is normal.  Nursing note and vitals reviewed.    ED Treatments / Results  Labs (all labs ordered are listed, but only abnormal results are displayed) Labs Reviewed - No data to display  EKG EKG Interpretation  Date/Time:  Monday March 25 2018 10:24:32 EDT Ventricular Rate:  78 PR Interval:    QRS Duration: 90 QT Interval:  406 QTC Calculation: 463 R Axis:   81 Text Interpretation:  Sinus rhythm Borderline short PR interval ST elev, probable normal early repol pattern No previous tracing Confirmed by Gwyneth SproutPlunkett, Aodhan Scheidt ([id number]) on 03/25/2018 10:42:20 AM   Radiology Dg Ribs Unilateral W/chest Left  Result Date: 03/25/2018 CLINICAL DATA:  Injury while playing basketball EXAM: LEFT RIBS AND CHEST - 3+ VIEW COMPARISON:  Dec 26, 2014 FINDINGS: Frontal chest as well as oblique and cone-down rib images obtained. Lungs are clear. Heart size and pulmonary vascularity are normal. No adenopathy. No pneumothorax or pleural effusion evident. No evident rib fracture. IMPRESSION: No evident rib fracture.  Lungs clear. Electronically Signed   By: Bretta BangWilliam  Woodruff III M.D.   On: 03/25/2018 11:07    Procedures Procedures (including critical care time)  Medications Ordered in ED Medications  ibuprofen (ADVIL,MOTRIN) tablet 400 mg (has no administration in time range)     Initial Impression / Assessment and Plan / ED Course  I have reviewed the triage vital signs and the nursing notes.  Pertinent labs & imaging results that were available during my care of the patient were reviewed by me and considered in my medical decision making (see chart for details).     Patient presenting today after an injury to the left chest while  playing basketball yesterday.  He does complain of mild aching of his wrists and ankles but that is most likely related to overuse yesterday since he had not played for quite some time.  There is no evidence of injury to these joints and do not feel he need imaging.  Patient is not short of breath and vital signs are reassuring.  Will get an x-ray to ensure no rib fracture.  Patient given ibuprofen.  EKG was done because he was having pain in the left side of his chest but chest is otherwise normal except for early repolarization.  11:18 AM CXR wnl.  Will treat for MSK pain.  Final Clinical Impressions(s) / ED Diagnoses   Final diagnoses:  Chest wall pain    ED Discharge Orders    None       Gwyneth SproutPlunkett, Mariellen Blaney, MD 03/25/18 1120

## 2018-08-08 ENCOUNTER — Emergency Department (HOSPITAL_BASED_OUTPATIENT_CLINIC_OR_DEPARTMENT_OTHER)
Admission: EM | Admit: 2018-08-08 | Discharge: 2018-08-08 | Disposition: A | Discharge status: Home / Self Care | Payer: Medicare Other | Attending: Emergency Medicine | Admitting: Emergency Medicine

## 2018-08-08 ENCOUNTER — Emergency Department (HOSPITAL_BASED_OUTPATIENT_CLINIC_OR_DEPARTMENT_OTHER): Payer: Medicare Other

## 2018-08-08 ENCOUNTER — Encounter (HOSPITAL_BASED_OUTPATIENT_CLINIC_OR_DEPARTMENT_OTHER): Payer: Self-pay

## 2018-08-08 ENCOUNTER — Other Ambulatory Visit: Payer: Self-pay

## 2018-08-08 DIAGNOSIS — M79641 Pain in right hand: Secondary | ICD-10-CM | POA: Diagnosis present

## 2018-08-08 DIAGNOSIS — F17210 Nicotine dependence, cigarettes, uncomplicated: Secondary | ICD-10-CM | POA: Insufficient documentation

## 2018-08-08 NOTE — Discharge Instructions (Addendum)
Your Xray today was negative, you may alternate ibuprofen or tylenol for your pain. I have provided a velcro splint, you may wear this as needed for comfort.

## 2018-08-08 NOTE — ED Provider Notes (Signed)
[facility] EMERGENCY DEPARTMENT Provider Note   CSN: [id number] Arrival date & time: 08/08/18  1747     History   Chief Complaint Chief Complaint  Patient presents with  . Hand Injury    HPI Mitchell Hicks is a 27 y.o. male.  [age] y.o male with no PMH presents to the ED with a chief complaint of right arm pain x today. Patient reports punching a wall this morning after being angry.He reports the pain is located on the ulnar region of the hand and right thumb worst with movement and flexion and no alleviating factors. He denies any therapy for symptomatic relieve. He denies any other trauma.      History reviewed. No pertinent past medical history.  There are no active problems to display for this patient.   Past Surgical History:  Procedure Laterality Date  . APPENDECTOMY          Home Medications    Prior to Admission medications   Medication Sig Start Date End Date Taking? Authorizing Provider  benzonatate (TESSALON PERLES) 100 MG capsule Take 1 capsule (100 mg total) by mouth 3 (three) times daily as needed for cough. 09/12/17   Mackuen, Courteney Lyn, MD  ondansetron (ZOFRAN ODT) 4 MG disintegrating tablet Take 1 tablet (4 mg total) by mouth every 8 (eight) hours as needed for nausea or vomiting. 09/12/17   Mackuen, Courteney Lyn, MD  oseltamivir (TAMIFLU) 75 MG capsule Take 1 capsule (75 mg total) by mouth every 12 (twelve) hours. 09/12/17   Mackuen, Cindee Saltourteney Lyn, MD    Family History No family history on file.  Social History Social History   Tobacco Use  . Smoking status: Current Every Day Smoker    Packs/day: 1.00    Types: Cigarettes  . Smokeless tobacco: Never Used  Substance Use Topics  . Alcohol use: No  . Drug use: No     Allergies   Patient has no known allergies.   Review of Systems Review of Systems  Constitutional: Negative for fever.  Musculoskeletal: Positive for arthralgias and myalgias.     Physical Exam Updated  Vital Signs BP 117/76 (BP Location: Left Arm)   Pulse 76   Temp 99.8 F (37.7 C) (Oral)   Resp 16   Ht 5\' 11"  (1.803 m)   Wt 63.5 kg   SpO2 100%   BMI 19.53 kg/m   Physical Exam Vitals signs and nursing note reviewed.  Constitutional:      Appearance: Normal appearance. He is well-developed.  HENT:     Head: Normocephalic and atraumatic.  Eyes:     General: No scleral icterus.    Pupils: Pupils are equal, round, and reactive to light.  Neck:     Musculoskeletal: Normal range of motion.  Cardiovascular:     Heart sounds: Normal heart sounds.  Pulmonary:     Effort: Pulmonary effort is normal.     Breath sounds: Normal breath sounds. No wheezing.  Chest:     Chest wall: No tenderness.  Abdominal:     General: Bowel sounds are normal. There is no distension.     Palpations: Abdomen is soft.     Tenderness: There is no abdominal tenderness.  Musculoskeletal:        General: No deformity.     Right wrist: He exhibits no tenderness and no bony tenderness.     Right hand: He exhibits tenderness and swelling. He exhibits normal capillary refill and no laceration. Normal sensation noted. Decreased  sensation is not present in the ulnar distribution, is not present in the medial distribution and is not present in the radial distribution. Normal strength noted. He exhibits no finger abduction, no thumb/finger opposition and no wrist extension trouble.  Skin:    General: Skin is warm and dry.  Neurological:     Mental Status: He is alert and oriented to person, place, and time.      ED Treatments / Results  Labs (all labs ordered are listed, but only abnormal results are displayed) Labs Reviewed - No data to display  EKG None  Radiology Dg Hand Complete Right  Result Date: 08/08/2018 CLINICAL DATA:  Pain and swelling over fifth metacarpal. EXAM: RIGHT HAND - COMPLETE 3+ VIEW COMPARISON:  None. FINDINGS: No fracture to the fifth metacarpal identified to explain the  patient's symptoms. The fingers are intact. Well cortical calcifications distal to the ulnar likely from previous trauma. No definitive acute carpal bone fracture noted. IMPRESSION: No acute fracture identified. Probable old fracture of the distal ulna. Electronically Signed   By: Gerome Samavid  Williams III M.D   On: 08/08/2018 18:47    Procedures Procedures (including critical care time)  Medications Ordered in ED Medications - No data to display   Initial Impression / Assessment and Plan / ED Course  I have reviewed the triage vital signs and the nursing notes.  Pertinent labs & imaging results that were available during my care of the patient were reviewed by me and considered in my medical decision making (see chart for details).    Patient presents with right hand pain after punching a wall this morning. He reports pain is worse with flexion and around this thumb region. He has not tried any therapy for relieve.  Valuation patient is texting on his cell phone with girlfriend at the bedside along with ice to his right hand.  DG right hand showed no acute fracture, probable old fracture of the distal ulna which is healing.  She will be provided with a Velcro splint for comfort along with return precautions.  He is well-appearing, with girlfriend at the bedside sleeping.  Return precautions provided.   Final Clinical Impressions(s) / ED Diagnoses   Final diagnoses:  Right hand pain    ED Discharge Orders    None       Claude MangesSoto, Karel Turpen, Cordelia Poche-C 08/08/18 1914    Jacalyn LefevreHaviland, Julie, MD 08/08/18 1918

## 2018-08-08 NOTE — ED Triage Notes (Signed)
Pt c/o pain to right hand/wrist after punching a wall this am-no break in skin noted-NAD-steady gait

## 2020-03-11 IMAGING — CR DG HAND COMPLETE 3+V*R*
3 series · 3 of 3 positions shown · non-contrast
Comparison: None.

CLINICAL DATA: Pain and swelling over fifth metacarpal.

EXAM:
RIGHT HAND - COMPLETE 3+ VIEW

[x hand pa right]
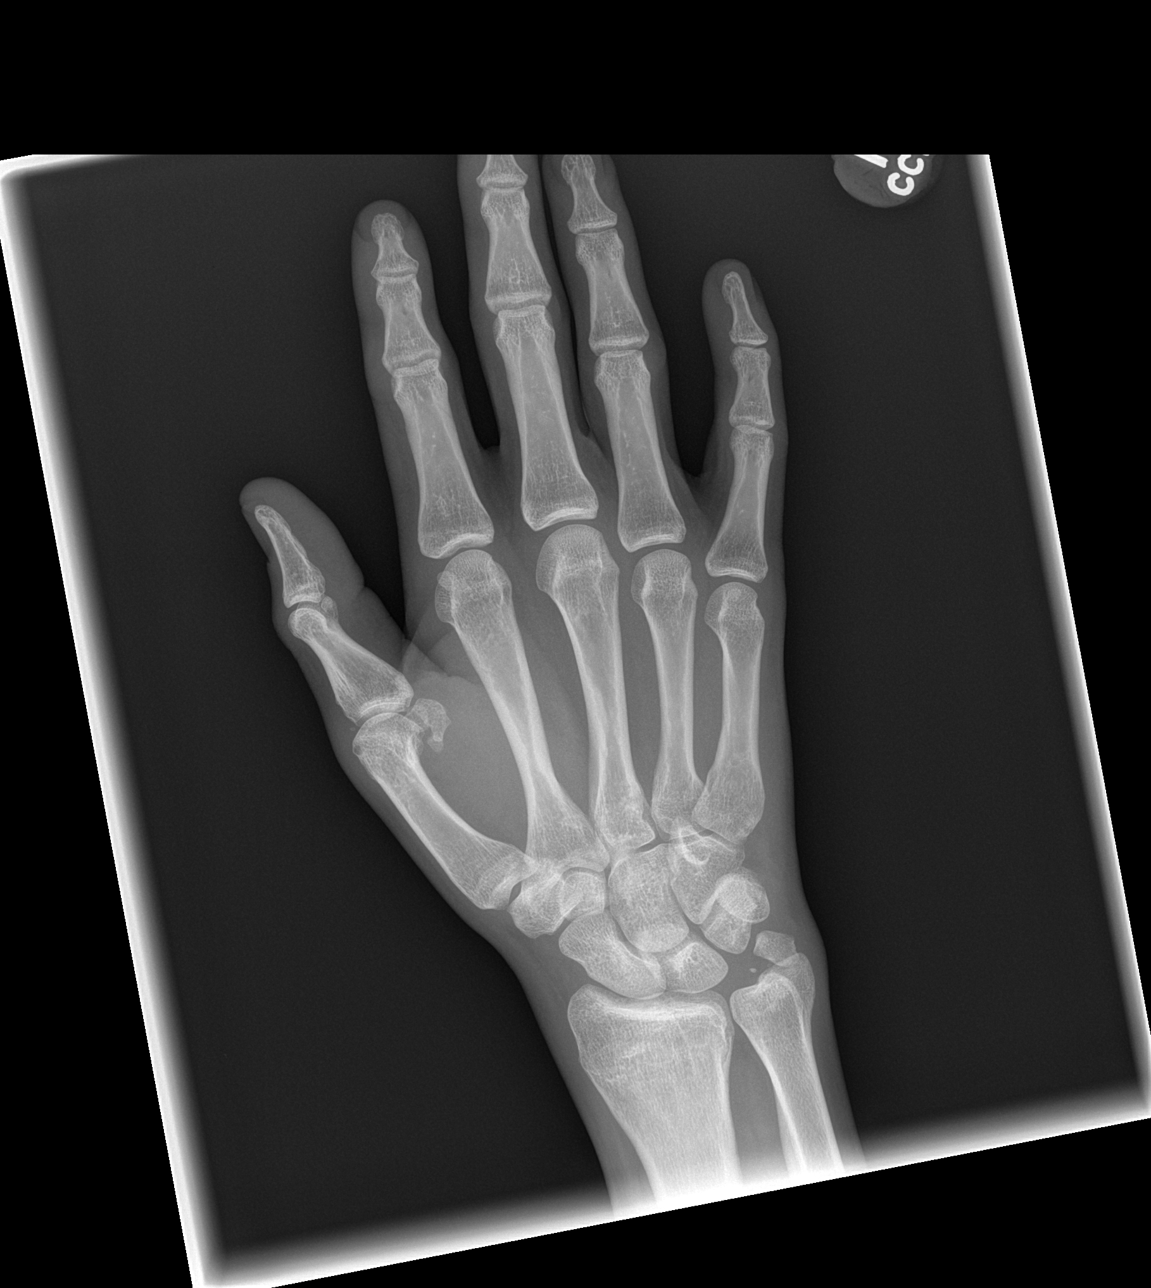

[x hand oblique right]
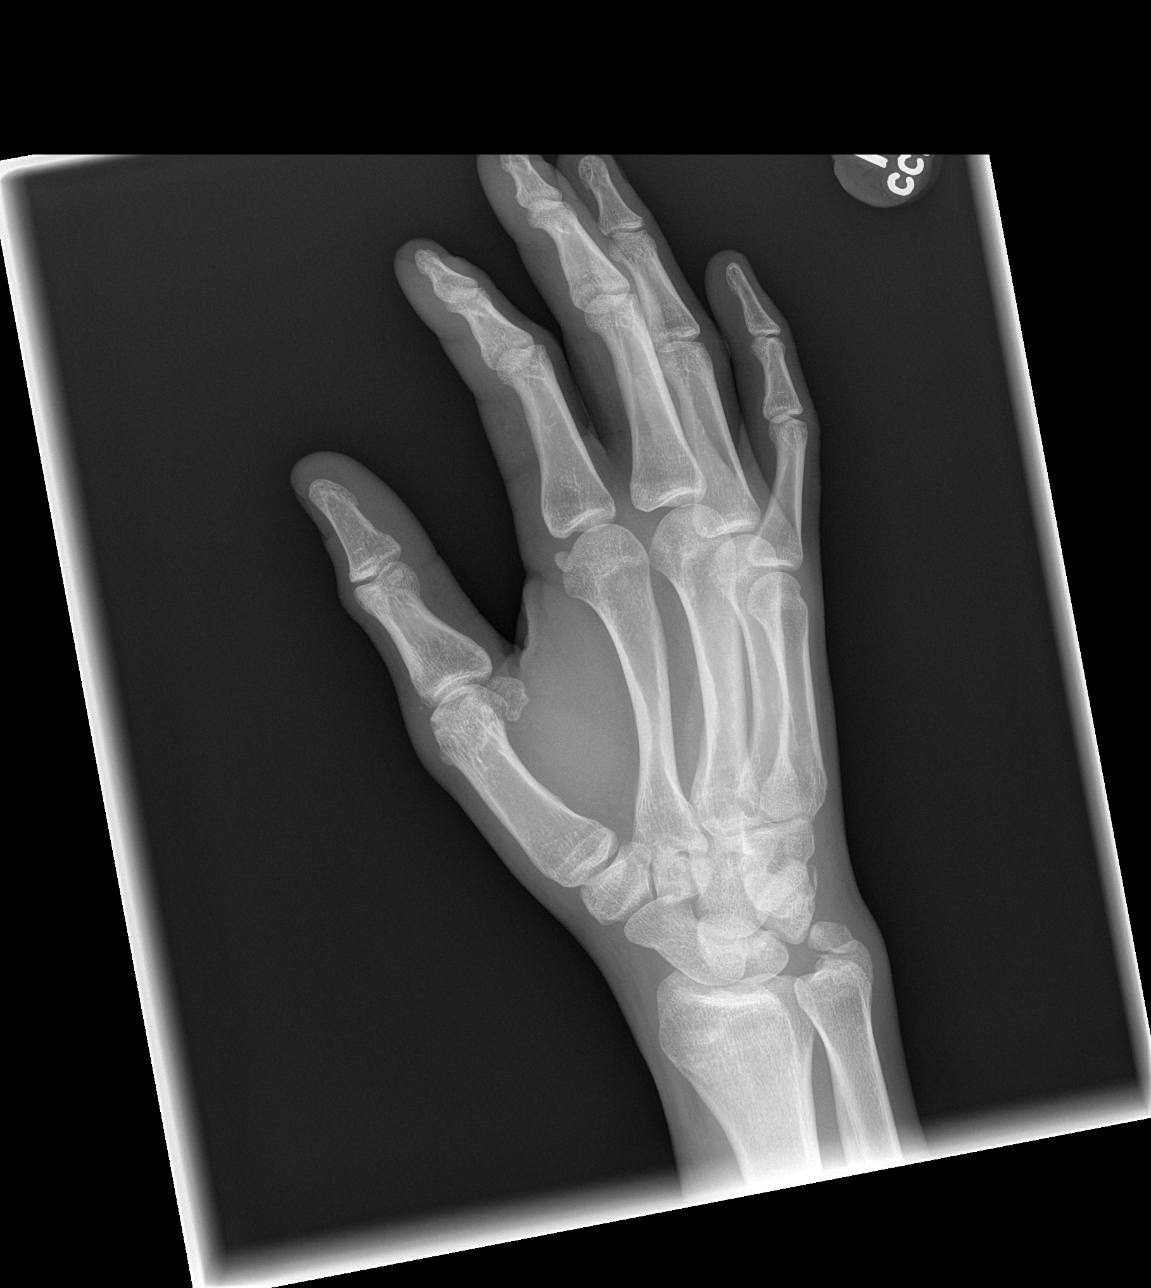

[x hand lat right]
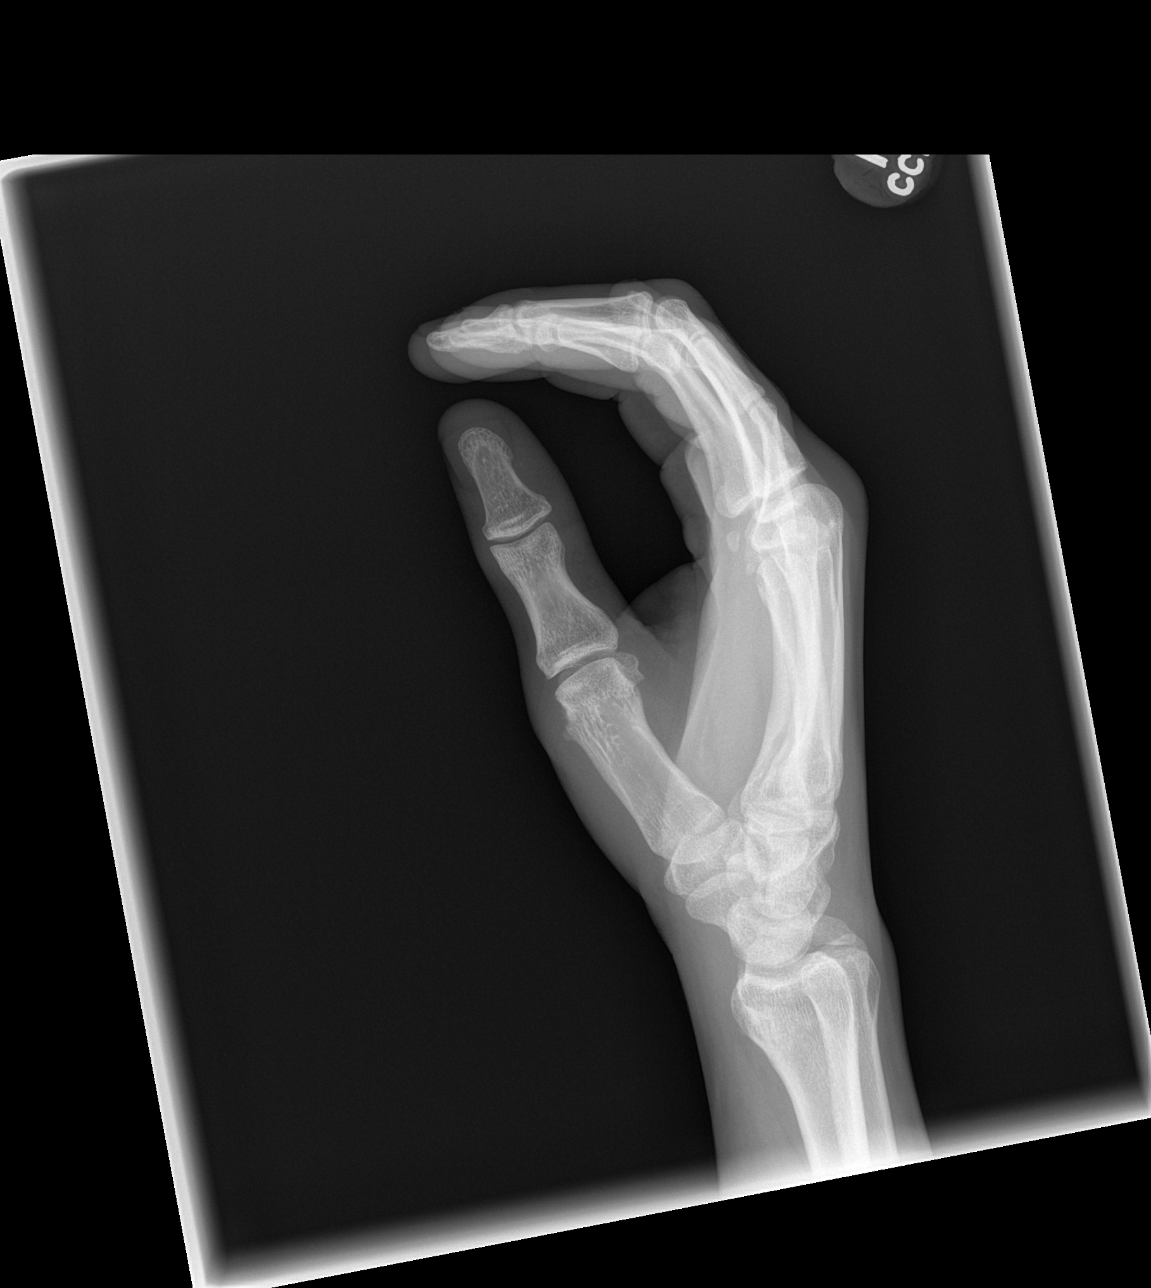

[3 of 3 positions shown; findings below may reference images not displayed]

FINDINGS: No fracture to the fifth metacarpal identified to explain the
patient's symptoms. The fingers are intact. Well cortical
calcifications distal to the ulnar likely from previous trauma. No
definitive acute carpal bone fracture noted.
IMPRESSION: No acute fracture identified. Probable old fracture of the distal
ulna.
